# Patient Record
Sex: Male | Born: 1990 | Race: Black or African American | Hispanic: No | Marital: Single | Smoking: Never smoker
Health system: Southern US, Community
[De-identification: ages and names within clinical notes are randomized; demographics above are authoritative.]

## PROBLEM LIST (undated history)

## (undated) ENCOUNTER — Ambulatory Visit: Discharge status: Absent without leave | Payer: Commercial Managed Care - PPO

---

## 2011-06-26 ENCOUNTER — Emergency Department: Payer: Self-pay | Admitting: Emergency Medicine

## 2014-04-11 ENCOUNTER — Emergency Department: Payer: Self-pay | Admitting: Emergency Medicine

## 2014-04-11 LAB — CBC WITH DIFFERENTIAL/PLATELET
BASOS ABS: 0 10*3/uL (ref 0.0–0.1)
BASOS PCT: 0.6 %
Eosinophil #: 0.1 10*3/uL (ref 0.0–0.7)
Eosinophil %: 1 %
HCT: 46.6 % (ref 40.0–52.0)
HGB: 15.4 g/dL (ref 13.0–18.0)
LYMPHS ABS: 2.3 10*3/uL (ref 1.0–3.6)
Lymphocyte %: 28.3 %
MCH: 28.1 pg (ref 26.0–34.0)
MCHC: 32.9 g/dL (ref 32.0–36.0)
MCV: 85 fL (ref 80–100)
Monocyte #: 0.6 x10 3/mm (ref 0.2–1.0)
Monocyte %: 7.4 %
NEUTROS ABS: 5.2 10*3/uL (ref 1.4–6.5)
NEUTROS PCT: 62.7 %
Platelet: 231 10*3/uL (ref 150–440)
RBC: 5.47 10*6/uL (ref 4.40–5.90)
RDW: 13.2 % (ref 11.5–14.5)
WBC: 8.3 10*3/uL (ref 3.8–10.6)

## 2014-04-11 LAB — TROPONIN I: Troponin-I: <0.02 ng/mL

## 2014-04-11 LAB — BASIC METABOLIC PANEL
Anion Gap: 5 — ABNORMAL LOW (ref 7–16)
BUN: 11 mg/dL (ref 7–18)
CHLORIDE: 104 mmol/L (ref 98–107)
CO2: 32 mmol/L (ref 21–32)
Calcium, Total: 9 mg/dL (ref 8.5–10.1)
Creatinine: 1.09 mg/dL (ref 0.60–1.30)
EGFR (African American): >60
EGFR (Non-African Amer.): >60
GLUCOSE: 99 mg/dL (ref 65–99)
OSMOLALITY: 281 (ref 275–301)
POTASSIUM: 3.7 mmol/L (ref 3.5–5.1)
SODIUM: 141 mmol/L (ref 136–145)

## 2017-08-27 ENCOUNTER — Other Ambulatory Visit: Payer: Self-pay

## 2017-08-27 ENCOUNTER — Ambulatory Visit
Admission: EM | Admit: 2017-08-27 | Discharge: 2017-08-27 | Disposition: A | Discharge status: Home / Self Care | Payer: Commercial Managed Care - PPO | Attending: Family Medicine | Admitting: Family Medicine

## 2017-08-27 ENCOUNTER — Ambulatory Visit (INDEPENDENT_AMBULATORY_CARE_PROVIDER_SITE_OTHER): Payer: Commercial Managed Care - PPO

## 2017-08-27 ENCOUNTER — Encounter: Payer: Self-pay | Admitting: Emergency Medicine

## 2017-08-27 DIAGNOSIS — S83412A Sprain of medial collateral ligament of left knee, initial encounter: Secondary | ICD-10-CM

## 2017-08-27 DIAGNOSIS — T07XXXA Unspecified multiple injuries, initial encounter: Secondary | ICD-10-CM

## 2017-08-27 MED ORDER — MUPIROCIN 2 % EX OINT: 1.0000 "application " | TOPICAL_OINTMENT | Freq: Three times a day (TID) | CUTANEOUS | 0 refills | Status: DC

## 2017-08-27 MED ORDER — NAPROXEN 500 MG PO TABS: 500.0000 mg | ORAL_TABLET | Freq: Two times a day (BID) | ORAL | 0 refills | Status: DC
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
## 2017-08-27 NOTE — Discharge Instructions (Signed)
Ice 20 minutes out of every 2 hours 4-5 times daily for pain or swelling.  Ace wrap may give more comfort.  Perform isometric exercises as demonstrated to you  10 repetitions several times daily.  May add ankle weights as tolerated up to 5 pounds.  Followup with orthopedic surgery if you are not improving.

## 2018-04-26 ENCOUNTER — Emergency Department
Admission: EM | Admit: 2018-04-26 | Discharge: 2018-04-26 | Disposition: A | Discharge status: Home / Self Care | Payer: Commercial Managed Care - PPO | Attending: Emergency Medicine | Admitting: Emergency Medicine

## 2018-04-26 ENCOUNTER — Other Ambulatory Visit: Payer: Self-pay

## 2018-04-26 ENCOUNTER — Encounter: Payer: Self-pay | Admitting: Emergency Medicine

## 2018-04-26 DIAGNOSIS — Z79899 Other long term (current) drug therapy: Secondary | ICD-10-CM | POA: Insufficient documentation

## 2018-04-26 DIAGNOSIS — J029 Acute pharyngitis, unspecified: Secondary | ICD-10-CM | POA: Insufficient documentation

## 2018-04-26 DIAGNOSIS — J020 Streptococcal pharyngitis: Secondary | ICD-10-CM

## 2018-04-26 LAB — GROUP A STREP BY PCR: Group A Strep by PCR: DETECTED — AB

## 2018-04-26 MED ORDER — PENICILLIN V POTASSIUM 500 MG PO TABS: 500.0000 mg | ORAL_TABLET | Freq: Three times a day (TID) | ORAL | 0 refills | Status: AC

## 2018-04-26 NOTE — ED Notes (Signed)
Pt states that his throat started to hurt him on Thursday and he also had fevers. Also states having cold sweats and chills

## 2018-04-26 NOTE — ED Triage Notes (Signed)
C?O sore throat x 2 days.  Has called out of work, so will need a work note.

## 2018-04-26 NOTE — ED Provider Notes (Signed)
[facility] Emergency Department Provider Note   ____________________________________________    I have reviewed the triage vital signs and the nursing notes.   HISTORY  Chief Complaint Sore Throat     HPI Jesus James is a 27 y.o. male who presents with complaints of sore throat.  Patient reports fever over the last couple of days, now he just has a sore throat which he describes as mild to moderate.  No difficulty breathing.  No cough.  No sick contacts.   History reviewed. No pertinent past medical history.  There are no active problems to display for this patient.   History reviewed. No pertinent surgical history.  Prior to Admission medications   Medication Sig Start Date End Date Taking? Authorizing Provider  mupirocin ointment (BACTROBAN) 2 % Apply 1 application topically 3 (three) times daily. 08/27/17   Lutricia Feil, PA-C  naproxen (NAPROSYN) 500 MG tablet Take 1 tablet (500 mg total) by mouth 2 (two) times daily with a meal. 08/27/17   Lutricia Feil, PA-C  penicillin v potassium (VEETID) 500 MG tablet Take 1 tablet (500 mg total) by mouth 3 (three) times daily for 10 days. 04/26/18 05/06/18  Jene Every, MD     Allergies Patient has no known allergies.  No family history on file.  Social History Social History   Tobacco Use  . Smoking status: Never Smoker  . Smokeless tobacco: Never Used  Substance Use Topics  . Alcohol use: Never    Frequency: Never  . Drug use: Never    Review of Systems  Constitutional: As above  ENT: As above   Gastrointestinal: No abdominal pain   Skin: Negative for rash.    ____________________________________________   PHYSICAL EXAM:  VITAL SIGNS: ED Triage Vitals  Enc Vitals Group     BP 04/26/18 1858 136/64     Pulse Rate 04/26/18 1858 67     Resp 04/26/18 1858 16     Temp 04/26/18 1858 98.5 F (36.9 C)     Temp Source 04/26/18 1858 Oral     SpO2 04/26/18 1858 98  %     Weight 04/26/18 1853 70.8 kg (156 lb 1.4 oz)     Height --      Head Circumference --      Peak Flow --      Pain Score 04/26/18 1852 3     Pain Loc --      Pain Edu? --      Excl. in GC? --      Constitutional: Alert and oriented. No acute distress. Pleasant and interactive Eyes: Conjunctivae are normal.   Mouth/Throat: Mucous membranes are moist.  Pharynx, bilateral tonsillar erythema with discharge Cardiovascular: Normal rate, regular rhythm.  Respiratory: Normal respiratory effort.  No retractions.     Neurologic:  Normal speech and language. No gross focal neurologic deficits are appreciated.   Skin:  Skin is warm, dry and intact. No rash noted.   ____________________________________________   LABS (all labs ordered are listed, but only abnormal results are displayed)  Labs Reviewed  GROUP A STREP BY PCR - Abnormal; Notable for the following components:      Result Value   Group A Strep by PCR DETECTED (*)    All other components within normal limits   ____________________________________________  EKG   ____________________________________________  RADIOLOGY  None ____________________________________________   PROCEDURES  Procedure(s) performed: No  Procedures   Critical Care performed: No ____________________________________________  INITIAL IMPRESSION / ASSESSMENT AND PLAN / ED COURSE  Pertinent labs & imaging results that were available during my care of the patient were reviewed by me and considered in my medical decision making (see chart for details).  Strep positive, will treat with penicillin   ____________________________________________   FINAL CLINICAL IMPRESSION(S) / ED DIAGNOSES  Final diagnoses:  Strep throat      NEW MEDICATIONS STARTED DURING THIS VISIT:  Discharge Medication List as of 04/26/2018  7:52 PM    START taking these medications   Details  penicillin v potassium (VEETID) 500 MG tablet Take 1  tablet (500 mg total) by mouth 3 (three) times daily for 10 days., Starting Sun 04/26/2018, Until Wed 05/06/2018, Normal         Note:  This document was prepared using Dragon voice recognition software and may include unintentional dictation errors.    Jene EveryKinner, [place] Hattabaugh, MD 04/26/18 2007

## 2018-10-08 ENCOUNTER — Ambulatory Visit
Admission: EM | Admit: 2018-10-08 | Discharge: 2018-10-08 | Disposition: A | Discharge status: Home / Self Care | Payer: Commercial Managed Care - PPO | Attending: Urgent Care | Admitting: Urgent Care

## 2018-10-08 ENCOUNTER — Encounter: Payer: Self-pay | Admitting: Emergency Medicine

## 2018-10-08 ENCOUNTER — Other Ambulatory Visit: Payer: Self-pay

## 2018-10-08 DIAGNOSIS — M545 Low back pain, unspecified: Secondary | ICD-10-CM

## 2018-10-08 DIAGNOSIS — X509XXA Other and unspecified overexertion or strenuous movements or postures, initial encounter: Secondary | ICD-10-CM

## 2018-10-08 MED ORDER — IBUPROFEN 800 MG PO TABS: 800.0000 mg | ORAL_TABLET | Freq: Three times a day (TID) | ORAL | 0 refills | Status: DC

## 2018-10-08 NOTE — Discharge Instructions (Addendum)
It was very nice meeting you today in clinic. Thank you for entrusting me with your care.   Please utilize the medications that we discussed. Your prescriptions have been called in to your pharmacy. Moist heat may also help with your pain/  Make arrangements to follow up with your regular doctor in 1 week for re-evaluation. If your symptoms/condition worsens, please seek follow up care either here or in the ER. Please remember, our [facility] providers are "right here with you" when you need [place].   Again, it was my pleasure to take care of you today. Thank you for choosing our clinic. I hope that you start to feel better quickly.   Quentin Mulling, MSN, APRN, FNP-C, CEN Advanced Practice Provider [facility]

## 2018-10-08 NOTE — ED Provider Notes (Signed)
[address], [address] [place], [place] [address] [phone]   Name: Jesus James DOB: [birth year] MRN: [id number] CSN: [id number] PCP: Patient, No Pcp Per  Arrival date and time:  10/08/18 1519  Chief Complaint:  Back Pain and doctor's note  NOTE: Prior to seeing the patient today, I have reviewed the triage nursing documentation and vital signs. Clinical staff has updated patient's PMH/PSHx, current medication list, and drug allergies/intolerances to ensure comprehensive history available to assist in medical decision making.   History:   HPI: Jesus James is a 28 y.o. male who presents today with complaints of lower back pain.  Patient advising that he is employed in a physically demanding job at [employer].  On Tuesday (10/06/2018), patient is jobless changed to one that requires him to do a great deal of bending and stooping.  Since the change in his job duties, patient has experienced pain in his lower back.  Patient denies radiation of pain into her lower extremities.  No extremity weakness.  No urinary incontinence.  Patient denies previous back injuries.  No previous back surgeries. Patient is requesting a note for his job.  History reviewed. No pertinent past medical history.  History reviewed. No pertinent surgical history.  History reviewed. No pertinent family history.  Social History   Socioeconomic History  . Marital status: Unknown    Spouse name: Not on file  . Number of children: Not on file  . Years of education: Not on file  . Highest education level: Not on file  Occupational History  . Not on file  Social Needs  . Financial resource strain: Not on file  . Food insecurity:    Worry: Not on file    Inability: Not on file  . Transportation needs:    Medical: Not on file    Non-medical: Not on file  Tobacco Use  . Smoking status: Never Smoker  . Smokeless tobacco: Never Used  Substance and Sexual Activity  . Alcohol use: Never   Frequency: Never  . Drug use: Never  . Sexual activity: Not on file  Lifestyle  . Physical activity:    Days per week: Not on file    Minutes per session: Not on file  . Stress: Not on file  Relationships  . Social connections:    Talks on phone: Not on file    Gets together: Not on file    Attends religious service: Not on file    Active member of club or organization: Not on file    Attends meetings of clubs or organizations: Not on file    Relationship status: Not on file  . Intimate partner violence:    Fear of current or ex partner: Not on file    Emotionally abused: Not on file    Physically abused: Not on file    Forced sexual activity: Not on file  Other Topics Concern  . Not on file  Social History Narrative  . Not on file    There are no active problems to display for this patient.   Home Medications:    No outpatient medications have been marked as taking for the 10/08/18 encounter [facility] Encounter).    Allergies:   Patient has no known allergies.  Review of Systems (ROS): Review of Systems  Constitutional: Negative for chills and fever.  Respiratory: Negative for cough and shortness of breath.   Cardiovascular: Negative for chest pain and palpitations.  Musculoskeletal: Positive for back pain.  Neurological:  Negative for seizures, weakness and numbness.     Physical Exam:  Triage Vital Signs ED Triage Vitals  Enc Vitals Group     BP 10/08/18 1530 (!) 142/88     Pulse Rate 10/08/18 1530 64     Resp 10/08/18 1530 18     Temp 10/08/18 1530 98.1 F (36.7 C)     Temp Source 10/08/18 1530 Oral     SpO2 10/08/18 1530 99 %     Weight 10/08/18 1531 165 lb (74.8 kg)     Height 10/08/18 1531  (1.727 m)     Head Circumference --      Peak Flow --      Pain Score 10/08/18 1530 7     Pain Loc --      Pain Edu? --      Excl. in GC? --     Physical Exam  Constitutional: He is oriented to person, place, and time and well-developed,  well-nourished, and in no distress.  HENT:  Head: Normocephalic and atraumatic.  Mouth/Throat: Mucous membranes are normal.  Cardiovascular: Normal rate, regular rhythm, normal heart sounds and intact distal pulses. Exam reveals no gallop and no friction rub.  No murmur heard. Pulmonary/Chest: Effort normal and breath sounds normal. No respiratory distress. He has no wheezes. He has no rales.  Musculoskeletal:     Lumbar back: He exhibits tenderness and pain. He exhibits no swelling, no deformity, no spasm and normal pulse.  Neurological: He is alert and oriented to person, place, and time.  Skin: Skin is warm and dry. No rash noted. No erythema.  Psychiatric: Mood, affect and judgment normal.  Nursing note and vitals reviewed.    Urgent Care Treatments / Results:   LABS: PLEASE NOTE: all labs that were ordered this encounter are listed, however only abnormal results are displayed. Labs Reviewed - No data to display  EKG: -None  RADIOLOGY: No results found.  PRODEDURES: Procedures  MEDICATIONS RECEIVED THIS VISIT: Medications - No data to display  PERTINENT CLINICAL COURSE NOTES/UPDATES: No data to display   Initial Impression / Assessment and Plan / Urgent Care Course:    Jesus James is a 28 y.o. male who presents to [facility] today with complaints of Back Pain and doctor's note  Pertinent labs & imaging results that were available during my care of the patient were personally reviewed by me and considered in my medical decision making (see lab/imaging section of note for values and interpretations).  Exam consistent with lumbosacral strain secondary to increased bending and stooping.  Discussed using moist heat to help with pain, in addition to anti-inflammatory (ibuprofen).  Encourage patient to discuss potential ergonomics assessment with his employer to determine potential duty modifications aimed at improving back safety.  Documentation provided per patient  request stating the same.  Discussed follow up with primary care physician in 1 week for re-evaluation. I have reviewed the follow up and strict return precautions for any new or worsening symptoms. Patient is aware of symptoms that would be deemed urgent/emergent, and would thus require further evaluation either here or in the emergency department. At the time of discharge, he verbalized understanding and consent with the discharge plan as it was reviewed with him. All questions were fielded by provider and/or clinic staff prior to patient discharge.    Final Clinical Impressions(s) / Urgent Care Diagnoses:   Final diagnoses:  Acute right-sided low back pain without sciatica    New Prescriptions:  Meds ordered this encounter  Medications  . ibuprofen (ADVIL) 800 MG tablet    Sig: Take 1 tablet (800 mg total) by mouth 3 (three) times daily.    Dispense:  21 tablet    Refill:  0    Controlled Substance Prescriptions:  [place] Controlled Substance Registry consulted? Not Applicable  NOTE: This note was prepared using Dragon dictation software along with smaller phrase technology. Despite my best ability to proofread, there is the potential that transcriptional errors may still occur from this process, and are completely unintentional.     Verlee MonteGray, Tremond Shimabukuro E, NP 10/08/18 [phone]

## 2018-10-08 NOTE — ED Triage Notes (Signed)
Patient states that he has a new position at work where he has to squat down all the time and it is hurting his back. He has been having pain since Tuesday. Patient is requesting a note due to his boss stating he would have to have a note in order to not do that job anymore.

## 2018-10-09 ENCOUNTER — Ambulatory Visit
Admission: EM | Admit: 2018-10-09 | Discharge: 2018-10-09 | Disposition: A | Discharge status: Home / Self Care | Payer: Commercial Managed Care - PPO | Attending: Family Medicine | Admitting: Family Medicine

## 2018-10-09 ENCOUNTER — Other Ambulatory Visit: Payer: Self-pay

## 2018-10-09 ENCOUNTER — Encounter: Payer: Self-pay | Admitting: Emergency Medicine

## 2018-10-09 DIAGNOSIS — S39012A Strain of muscle, fascia and tendon of lower back, initial encounter: Secondary | ICD-10-CM | POA: Diagnosis not present

## 2018-10-09 MED ORDER — CYCLOBENZAPRINE HCL 10 MG PO TABS: 10.0000 mg | ORAL_TABLET | Freq: Every day | ORAL | 0 refills | Status: AC

## 2018-10-09 NOTE — ED Provider Notes (Signed)
[facility]    CSN: [id number] Arrival date & time: 10/09/18  1236     History   Chief Complaint Chief Complaint  Patient presents with  . Back Pain    HPI Jesus James is a 28 y.o. male.   28 yo male with a low back pain seen yesterday but states he needs a note for work stating some restrictions because the movement and lifting at work he states makes it worse.    Back Pain    History reviewed. No pertinent past medical history.  There are no active problems to display for this patient.   History reviewed. No pertinent surgical history.     Home Medications    Prior to Admission medications   Medication Sig Start Date End Date Taking? Authorizing Provider  cyclobenzaprine (FLEXERIL) 10 MG tablet Take 1 tablet (10 mg total) by mouth at bedtime. 10/09/18   Payton Mccallumonty, Jana Swartzlander, Jesus James  ibuprofen (ADVIL) 800 MG tablet Take 1 tablet (800 mg total) by mouth 3 (three) times daily. 10/08/18   Verlee MonteGray, Bryan E, NP    Family History History reviewed. No pertinent family history.  Social History Social History   Tobacco Use  . Smoking status: Never Smoker  . Smokeless tobacco: Never Used  Substance Use Topics  . Alcohol use: Never    Frequency: Never  . Drug use: Never     Allergies   Patient has no known allergies.   Review of Systems Review of Systems  Musculoskeletal: Positive for back pain.     Physical Exam Triage Vital Signs ED Triage Vitals  Enc Vitals Group     BP 10/09/18 1344 138/67     Pulse Rate 10/09/18 1344 62     Resp 10/09/18 1344 18     Temp 10/09/18 1344 98 F (36.7 C)     Temp Source 10/09/18 1344 Oral     SpO2 10/09/18 1344 100 %     Weight 10/09/18 1342 164 lb 14.5 oz (74.8 kg)     Height --      Head Circumference --      Peak Flow --      Pain Score 10/09/18 1342 6     Pain Loc --      Pain Edu? --      Excl. in GC? --    No data found.  Updated Vital Signs BP 138/67 (BP Location: Left Arm)   Pulse 62   Temp 98  F (36.7 C) (Oral)   Resp 18   Wt 74.8 kg   SpO2 100%   BMI 25.07 kg/m   Visual Acuity Right Eye Distance:   Left Eye Distance:   Bilateral Distance:    Right Eye Near:   Left Eye Near:    Bilateral Near:     Physical Exam Vitals signs and nursing note reviewed.  Constitutional:      General: He is not in acute distress.    Appearance: He is not toxic-appearing or diaphoretic.  Musculoskeletal:     Lumbar back: He exhibits tenderness (right sided paraspinous muscles) and spasm. He exhibits no bony tenderness, no swelling, no edema, no deformity, no laceration and normal pulse.  Neurological:     Mental Status: He is alert.      UC Treatments / Results  Labs (all labs ordered are listed, but only abnormal results are displayed) Labs Reviewed - No data to display  EKG None  Radiology No results found.  Procedures  Procedures (including critical care time)  Medications Ordered in UC Medications - No data to display  Initial Impression / Assessment and Plan / UC Course  I have reviewed the triage vital signs and the nursing notes.  Pertinent labs & imaging results that were available during my care of the patient were reviewed by me and considered in my medical decision making (see chart for details).      Final Clinical Impressions(s) / UC Diagnoses   Final diagnoses:  Strain of lumbar region, initial encounter     Discharge Instructions     Heat to area Continue medication prescribed yesterday    ED Prescriptions    Medication Sig Dispense Auth. Provider   cyclobenzaprine (FLEXERIL) 10 MG tablet Take 1 tablet (10 mg total) by mouth at bedtime. 30 tablet Payton Mccallum, Jesus James      1. diagnosis reviewed with patient 2. rx as per orders above; reviewed possible side effects, interactions, risks and benefits  3. Recommend supportive treatment as above 4. Follow-up prn if symptoms worsen or don't improve  Controlled Substance Prescriptions [place]  Controlled Substance Registry consulted? Not Applicable   Payton Mccallum, Jesus James 10/09/18 [phone]

## 2018-10-09 NOTE — Discharge Instructions (Signed)
Heat to area Continue medication prescribed yesterday

## 2018-10-09 NOTE — ED Triage Notes (Signed)
Pt c/o back pain. Started about 3 days ago. He was seen on 10/08/18 and given a work note but not better and the job that he does makes it worse. He needs a note stating his restrictions.
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
## 2023-12-26 ENCOUNTER — Emergency Department
Admission: EM | Admit: 2023-12-26 | Discharge: 2023-12-26 | Disposition: A | Discharge status: Home / Self Care | Attending: Emergency Medicine | Admitting: Emergency Medicine

## 2023-12-26 ENCOUNTER — Other Ambulatory Visit: Payer: Self-pay

## 2023-12-26 DIAGNOSIS — Y92410 Unspecified street and highway as the place of occurrence of the external cause: Secondary | ICD-10-CM | POA: Diagnosis not present

## 2023-12-26 DIAGNOSIS — S161XXA Strain of muscle, fascia and tendon at neck level, initial encounter: Secondary | ICD-10-CM | POA: Insufficient documentation

## 2023-12-26 DIAGNOSIS — S199XXA Unspecified injury of neck, initial encounter: Secondary | ICD-10-CM | POA: Diagnosis present

## 2023-12-26 MED ORDER — LIDOCAINE 5 % EX PTCH: 1.0000 | MEDICATED_PATCH | CUTANEOUS | 0 refills | Status: AC

## 2023-12-26 MED ORDER — MELOXICAM 15 MG PO TABS: 15.0000 mg | ORAL_TABLET | Freq: Every day | ORAL | 0 refills | Status: AC

## 2023-12-26 MED ORDER — METHOCARBAMOL 500 MG PO TABS: 500.0000 mg | ORAL_TABLET | Freq: Three times a day (TID) | ORAL | 0 refills | Status: AC | PRN
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
## 2023-12-26 NOTE — Discharge Instructions (Addendum)
 We believe that your symptoms are caused by musculoskeletal strain.  Please read through the included information about additional care such as heating pads, over-the-counter pain medicine. Remember that early mobility and using the affected part of your body is actually better than keeping it immobile.  You were prescribed Meloxicam  (antiinflammatory) and Methocarbamol  (muscle relaxer) to help with your pain.  Please take these medications only as prescribed. Please do not work, make legal-binding decisions, or operate a motor vehicle while taking the Methocarbamol .  Please do not take Ibuprofen , Aleve , Advil , Motrin , Naproxen , Aspirin, or any other non-steroidal antiinflammatory drug (NSAID) while taking the Meloxicam . Please stop taking the Meloxicam  if you experience any stomach cramping.  Follow-up with your PCP as needed or return to the emergency department with new or worsening symptoms that concern you.
